# Patient Record
Sex: Female | Born: 1990 | Race: White | Hispanic: No | Marital: Single | Smoking: Never smoker
Health system: Southern US, Community
[De-identification: ages and names within clinical notes are randomized; demographics above are authoritative.]

## PROBLEM LIST (undated history)

## (undated) DIAGNOSIS — R569 Unspecified convulsions: Secondary | ICD-10-CM

## (undated) HISTORY — DX: Unspecified convulsions: R56.9

## (undated) HISTORY — PX: OTHER SURGICAL HISTORY: SHX169

---
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
## 2011-03-13 IMAGING — CR DG THORACOLUMBAR SPINE 2V
4 series · 4 of 4 positions shown · non-contrast
Comparison: Lumbar spine films dated 05/03/2006

CLINICAL DATA: Back pain and posterior neck pain.  History of
scoliosis.

THORACOLUMBAR SPINE - 2 VIEW

[view not recorded (1 of 4)]
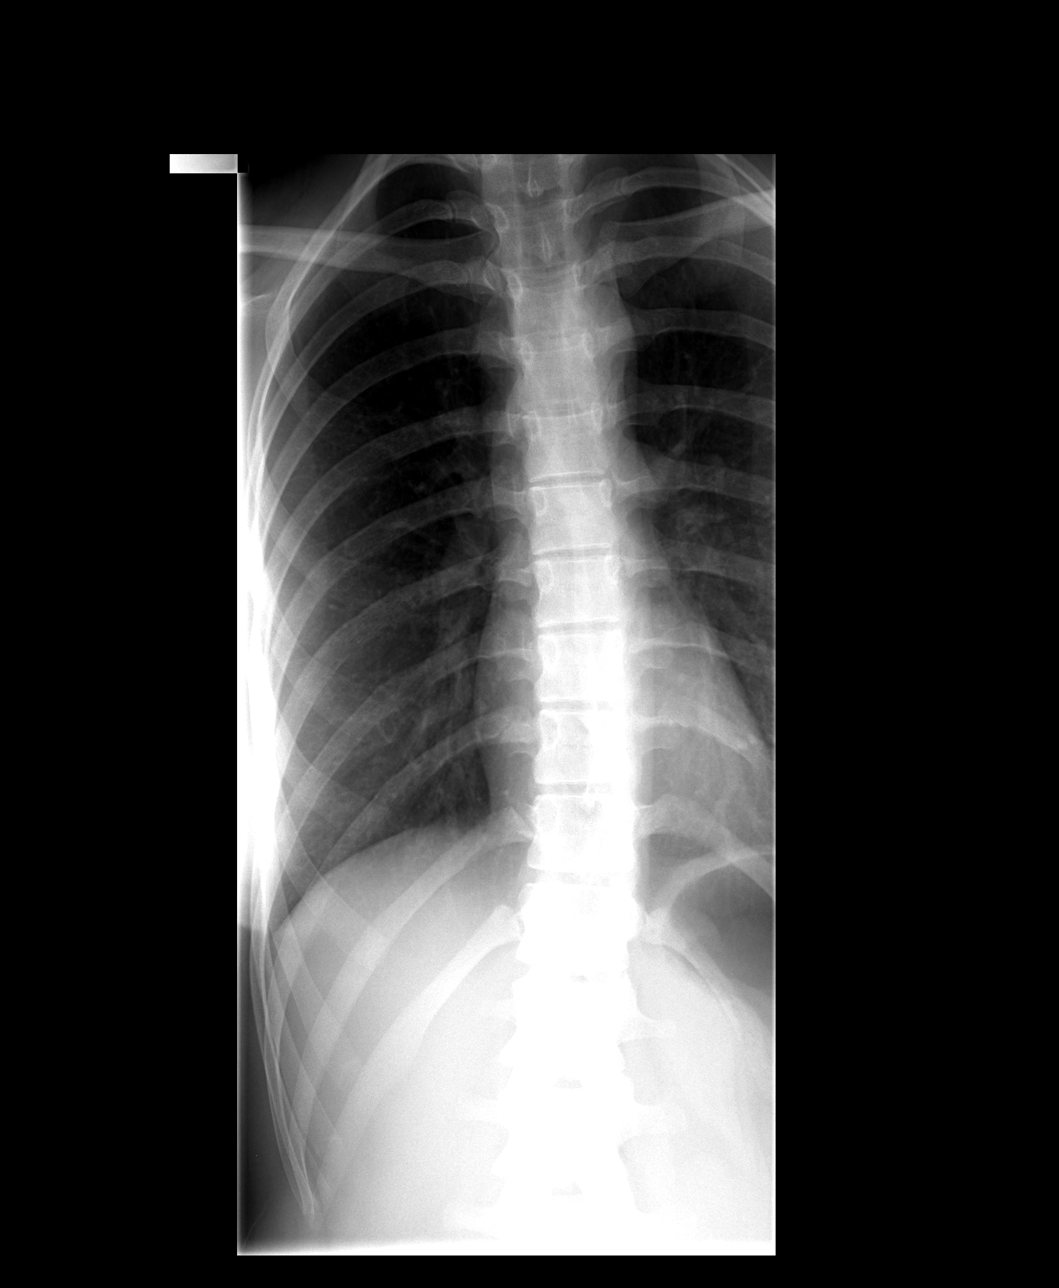

[view not recorded (2 of 4)]
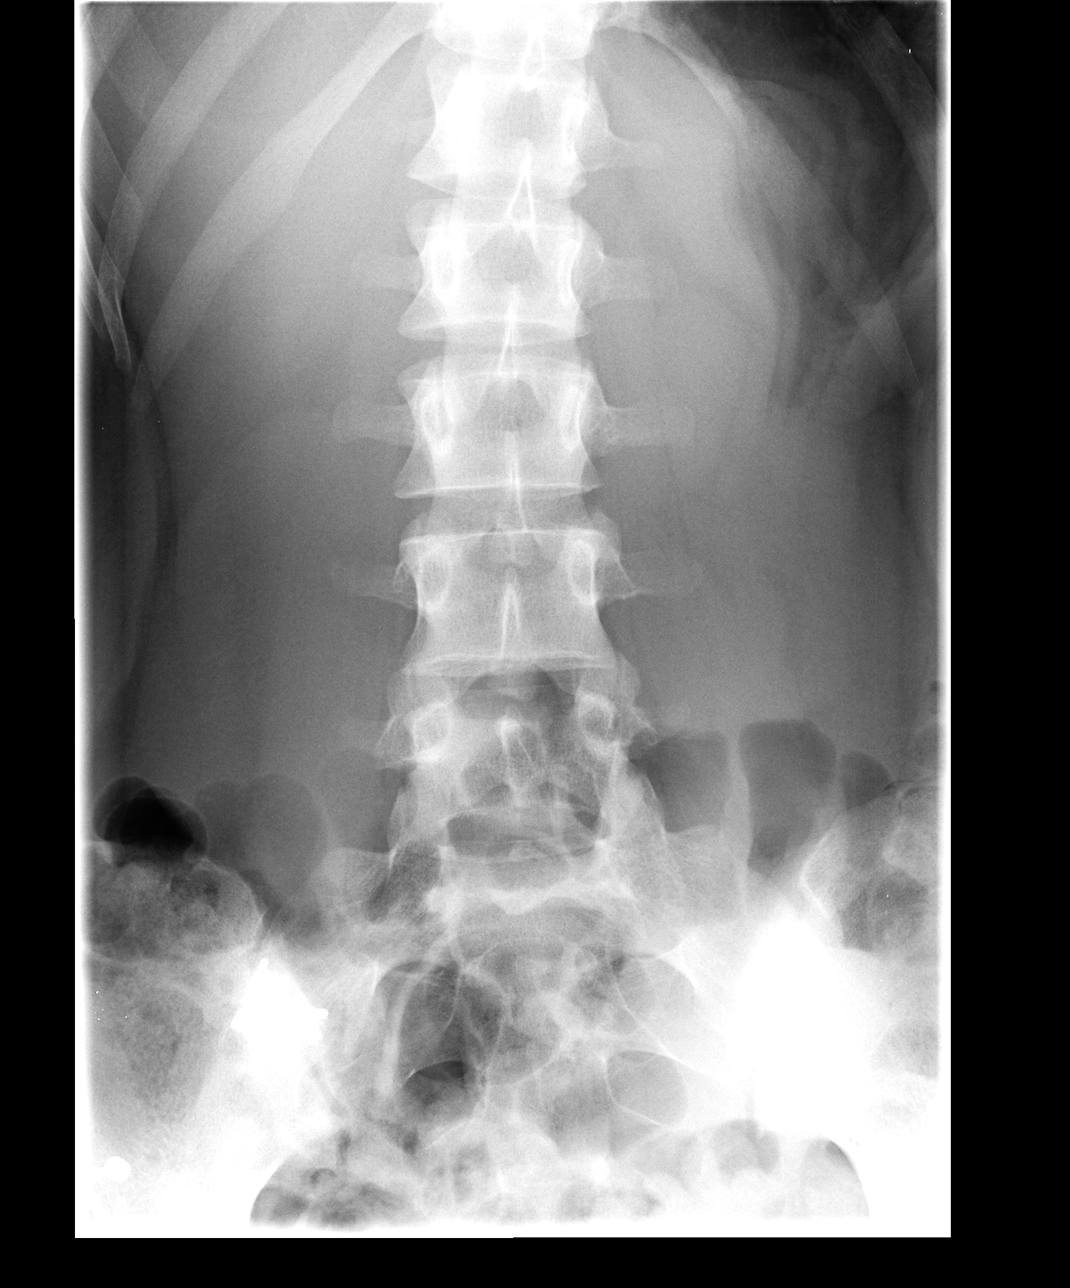

[view not recorded (3 of 4)]
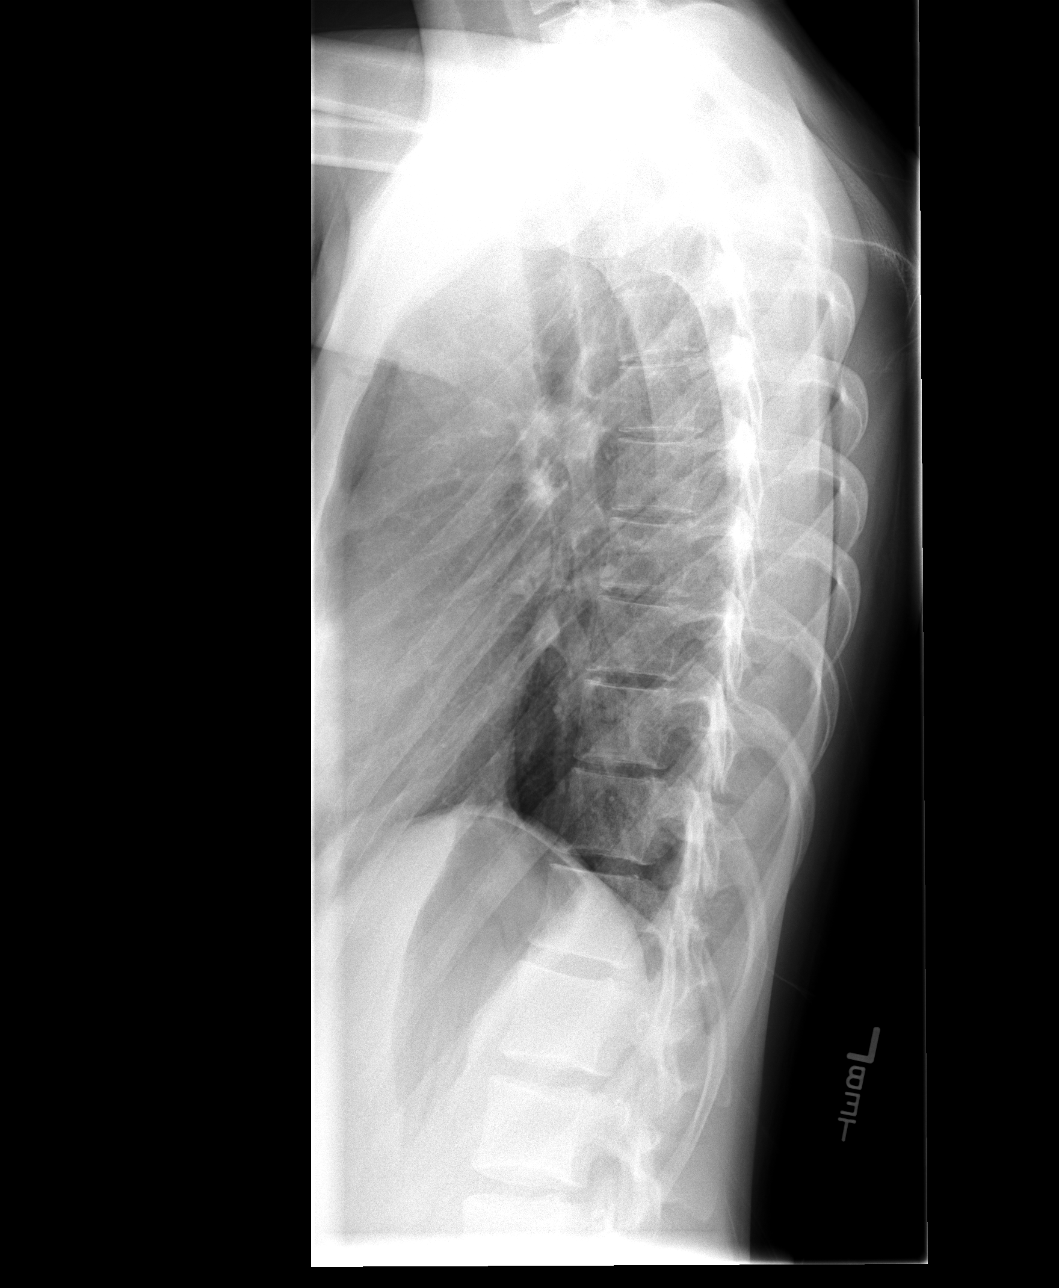

[view not recorded (4 of 4)]
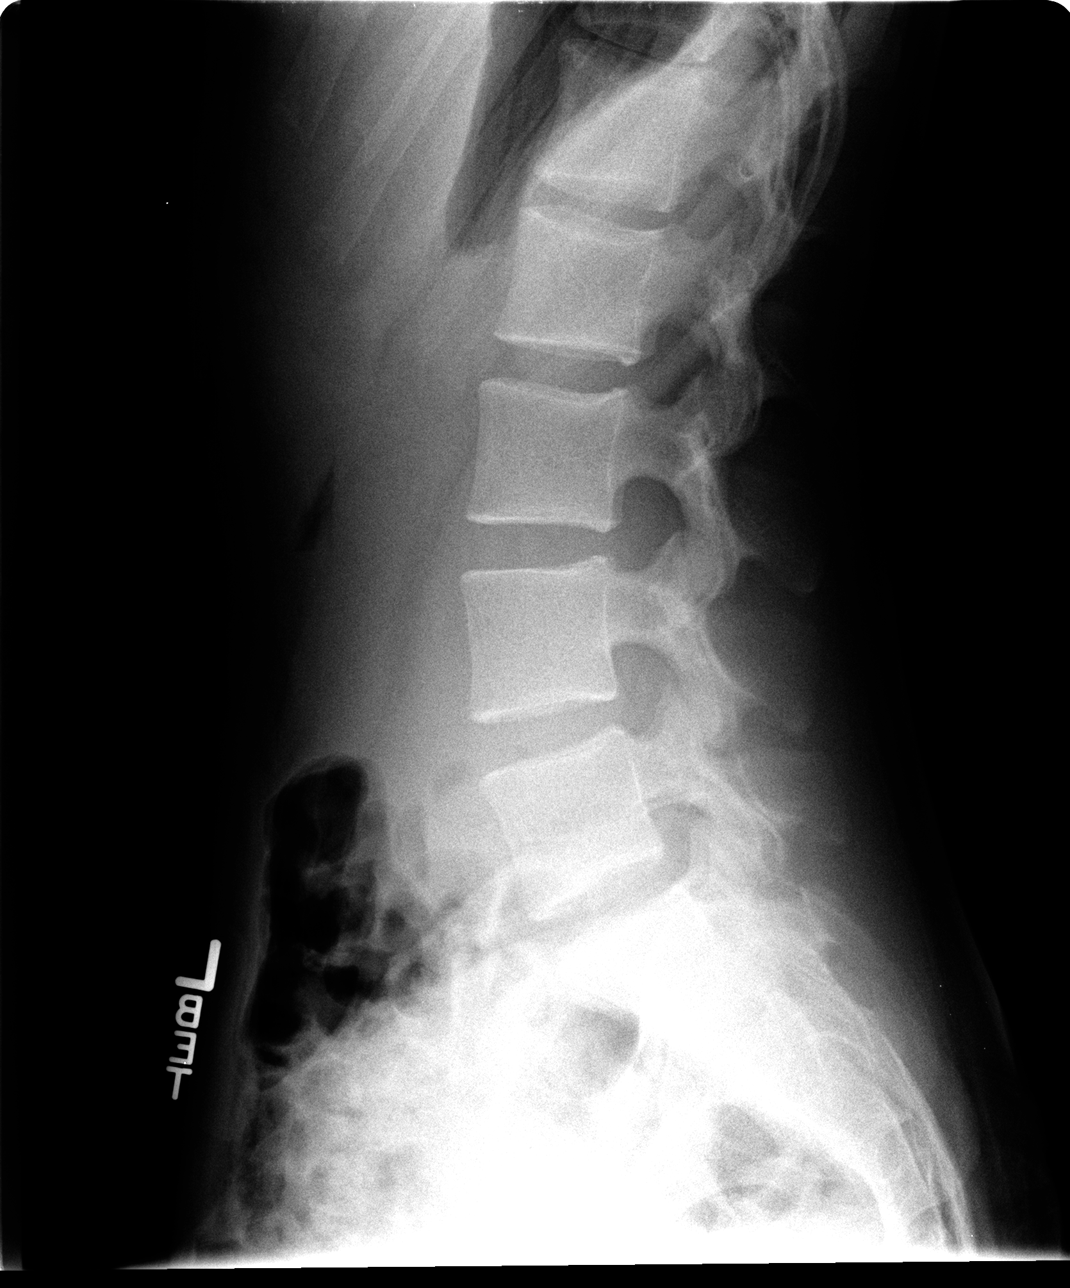

[4 of 4 positions shown; findings below may reference images not displayed]

FINDINGS: There is a mild thoracic scoliosis centered at T10 with
convexity to the left.  There is a 9 degree curvature.  There is a
mild rotoscoliosis of the lumbar spine, essentially unchanged since
the prior exam.
IMPRESSION: Mild thoracolumbar scoliosis as described.

## 2015-04-02 ENCOUNTER — Emergency Department (HOSPITAL_COMMUNITY)
Admission: EM | Admit: 2015-04-02 | Discharge: 2015-04-02 | Disposition: A | Discharge status: Home / Self Care | Payer: BLUE CROSS/BLUE SHIELD | Source: Home / Self Care | Attending: Family Medicine | Admitting: Family Medicine

## 2015-04-02 ENCOUNTER — Encounter (HOSPITAL_COMMUNITY): Payer: Self-pay | Admitting: Emergency Medicine

## 2015-04-02 DIAGNOSIS — J069 Acute upper respiratory infection, unspecified: Secondary | ICD-10-CM | POA: Diagnosis not present

## 2015-04-02 MED ORDER — AMOXICILLIN 250 MG PO CAPS: 250.0000 mg | ORAL_CAPSULE | Freq: Two times a day (BID) | ORAL | Status: DC

## 2015-04-02 MED ORDER — FLUTICASONE PROPIONATE 50 MCG/ACT NA SUSP: 2.0000 | Freq: Every day | NASAL | Status: AC

## 2015-04-02 NOTE — Discharge Instructions (Signed)
Upper Respiratory Infection, Adult °Most upper respiratory infections (URIs) are a viral infection of the air passages leading to the lungs. A URI affects the nose, throat, and upper air passages. The most common type of URI is nasopharyngitis and is typically referred to as "the common cold." °URIs run their course and usually go away on their own. Most of the time, a URI does not require medical attention, but sometimes a bacterial infection in the upper airways can follow a viral infection. This is called a secondary infection. Sinus and middle ear infections are common types of secondary upper respiratory infections. °Bacterial pneumonia can also complicate a URI. A URI can worsen asthma and chronic obstructive pulmonary disease (COPD). Sometimes, these complications can require emergency medical care and may be life threatening.  °CAUSES °Almost all URIs are caused by viruses. A virus is a type of germ and can spread from one person to another.  °RISKS FACTORS °You may be at risk for a URI if:  °· You smoke.   °· You have chronic heart or lung disease. °· You have a weakened defense (immune) system.   °· You are very young or very old.   °· You have nasal allergies or asthma. °· You work in crowded or poorly ventilated areas. °· You work in health care facilities or schools. °SIGNS AND SYMPTOMS  °Symptoms typically develop 2-3 days after you come in contact with a cold virus. Most viral URIs last 7-10 days. However, viral URIs from the influenza virus (flu virus) can last 14-18 days and are typically more severe. Symptoms may include:  °· Runny or stuffy (congested) nose.   °· Sneezing.   °· Cough.   °· Sore throat.   °· Headache.   °· Fatigue.   °· Fever.   °· Loss of appetite.   °· Pain in your forehead, behind your eyes, and over your cheekbones (sinus pain). °· Muscle aches.   °DIAGNOSIS  °Your health care provider may diagnose a URI by: °· Physical exam. °· Tests to check that your symptoms are not due to  another condition such as: °· Strep throat. °· Sinusitis. °· Pneumonia. °· Asthma. °TREATMENT  °A URI goes away on its own with time. It cannot be cured with medicines, but medicines may be prescribed or recommended to relieve symptoms. Medicines may help: °· Reduce your fever. °· Reduce your cough. °· Relieve nasal congestion. °HOME CARE INSTRUCTIONS  °· Take medicines only as directed by your health care provider.   °· Gargle warm saltwater or take cough drops to comfort your throat as directed by your health care provider. °· Use a warm mist humidifier or inhale steam from a shower to increase air moisture. This may make it easier to breathe. °· Drink enough fluid to keep your urine clear or pale yellow.   °· Eat soups and other clear broths and maintain good nutrition.   °· Rest as needed.   °· Return to work when your temperature has returned to normal or as your health care provider advises. You may need to stay home longer to avoid infecting others. You can also use a face mask and careful hand washing to prevent spread of the virus. °· Increase the usage of your inhaler if you have asthma.   °· Do not use any tobacco products, including cigarettes, chewing tobacco, or electronic cigarettes. If you need help quitting, ask your health care provider. °PREVENTION  °The best way to protect yourself from getting a cold is to practice good hygiene.  °· Avoid oral or hand contact with people with cold   symptoms.   °· Wash your hands often if contact occurs.   °There is no clear evidence that vitamin C, vitamin E, echinacea, or exercise reduces the chance of developing a cold. However, it is always recommended to get plenty of rest, exercise, and practice good nutrition.  °SEEK MEDICAL CARE IF:  °· You are getting worse rather than better.   °· Your symptoms are not controlled by medicine.   °· You have chills. °· You have worsening shortness of breath. °· You have brown or red mucus. °· You have yellow or brown nasal  discharge. °· You have pain in your face, especially when you bend forward. °· You have a fever. °· You have swollen neck glands. °· You have pain while swallowing. °· You have white areas in the back of your throat. °SEEK IMMEDIATE MEDICAL CARE IF:  °· You have severe or persistent: °¨ Headache. °¨ Ear pain. °¨ Sinus pain. °¨ Chest pain. °· You have chronic lung disease and any of the following: °¨ Wheezing. °¨ Prolonged cough. °¨ Coughing up blood. °¨ A change in your usual mucus. °· You have a stiff neck. °· You have changes in your: °¨ Vision. °¨ Hearing. °¨ Thinking. °¨ Mood. °MAKE SURE YOU:  °· Understand these instructions. °· Will watch your condition. °· Will get help right away if you are not doing well or get worse. °  °This information is not intended to replace advice given to you by your health care provider. Make sure you discuss any questions you have with your health care provider. °  °Document Released: 06/16/2000 Document Revised: 05/07/2014 Document Reviewed: 03/28/2013 °Elsevier Interactive Patient Education ©2016 [employer] ° °Cough, Adult °A cough helps to clear your throat and lungs. A cough may last only 2-3 weeks (acute), or it may last longer than 8 weeks (chronic). Many different things can cause a cough. A cough may be a sign of an illness or another medical condition. °HOME CARE °· Pay attention to any changes in your cough. °· Take medicines only as told by your doctor. °¨ If you were prescribed an antibiotic medicine, take it as told by your doctor. Do not stop taking it even if you start to feel better. °¨ Talk with your doctor before you try using a cough medicine. °· Drink enough fluid to keep your pee (urine) clear or pale yellow. °· If the air is dry, use a cold steam vaporizer or humidifier in your home. °· Stay away from things that make you cough at work or at home. °· If your cough is worse at night, try using extra pillows to raise your head up higher while you  sleep. °· Do not smoke, and try not to be around smoke. If you need help quitting, ask your doctor. °· Do not have caffeine. °· Do not drink alcohol. °· Rest as needed. °GET HELP IF: °· You have new problems (symptoms). °· You cough up yellow fluid (pus). °· Your cough does not get better after 2-3 weeks, or your cough gets worse. °· Medicine does not help your cough and you are not sleeping well. °· You have pain that gets worse or pain that is not helped with medicine. °· You have a fever. °· You are losing weight and you do not know why. °· You have night sweats. °GET HELP RIGHT AWAY IF: °· You cough up blood. °· You have trouble breathing. °· Your heartbeat is very fast. °  °This information is not intended to replace   advice given to you by your health care provider. Make sure you discuss any questions you have with your health care provider. °  °Document Released: 09/03/2010 Document Revised: 09/11/2014 Document Reviewed: 02/27/2014 °Elsevier Interactive Patient Education ©2016 [employer] Inc. ° °

## 2015-04-02 NOTE — ED Notes (Signed)
Here with sinus pressure, post nasal drip, and pain radiating behind eyes Started 3 days ago Both ears clogged, taking Mucinex with some relief

## 2015-04-02 NOTE — ED Provider Notes (Signed)
CSN: [id number]     Arrival date & time 04/02/15  1324 History   First MD Initiated Contact with Patient 04/02/15 1510     Chief Complaint  Patient presents with  . Sinus Problem   (Consider location/radiation/quality/duration/timing/severity/associated sxs/prior Treatment) HPI Sinus pressure and pain with  Drainage over the last couple weeks. Sudden onset of symptoms. Pain wit pressure,nasal congestion. OTC meds not helping.  History reviewed. No pertinent past medical history. Past Surgical History  Procedure Laterality Date  . Eustachian     No family history on file. Social History  Substance Use Topics  . Smoking status: Never Smoker   . Smokeless tobacco: None  . Alcohol Use: No   OB History    No data available     Review of Systems Sinus pain, congestion Allergies  Prednisone  Home Medications   Prior to Admission medications   Medication Sig Start Date End Date Taking? Authorizing Provider  Multiple Vitamin (MULTIVITAMIN) capsule Take 1 capsule by mouth daily.   Yes Historical Provider, MD  amoxicillin (AMOXIL) 250 MG capsule Take 1 capsule (250 mg total) by mouth 2 (two) times daily. 04/02/15   Tharon AquasFrank C Patrick, PA  fluticasone (FLONASE) 50 MCG/ACT nasal spray Place 2 sprays into both nostrils daily. 04/02/15   Tharon AquasFrank C Patrick, PA   Meds Ordered and Administered this Visit  Medications - No data to display  BP 134/70 mmHg  Pulse 80  Temp(Src) 98.9 F (37.2 C) (Oral)  Resp 16  SpO2 98%  LMP 03/09/2015 No data found.   Physical Exam NURSES NOTES AND VITAL SIGNS REVIEWED. CONSTITUTIONAL: Well developed, well nourished, no acute distress HEENT: normocephalic, atraumatic, right and left TM's are normal EYES: Conjunctiva normal NECK:normal ROM, supple, no adenopathy PULMONARY:No respiratory distress, normal effort, Lungs: CTAb/l, no wheezes, or increased work of breathing CARDIOVASCULAR: RRR, no murmur ABDOMEN: soft, ND, NT, +'ve BS MUSCULOSKELETAL:  Normal ROM of all extremities,  SKIN: warm and dry without rash PSYCHIATRIC: Mood and affect, behavior are normal  ED Course  Procedures (including critical care time)  Labs Review Labs Reviewed - No data to display  Imaging Review No results found.   Visual Acuity Review  Right Eye Distance:   Left Eye Distance:   Bilateral Distance:    Right Eye Near:   Left Eye Near:    Bilateral Near:       Rx amoxil  MDM   1. URI (upper respiratory infection)     Patient is reassured that there are no issues that require transfer to higher level of care at this time or additional tests. Patient is advised to continue home symptomatic treatment. Patient is advised that if there are new or worsening symptoms to attend the emergency department, contact primary care provider, or return to UC. Instructions of care provided discharged home in stable condition. Return to work/school note provided.   THIS NOTE WAS GENERATED USING A VOICE RECOGNITION SOFTWARE PROGRAM. ALL REASONABLE EFFORTS  WERE MADE TO PROOFREAD THIS DOCUMENT FOR ACCURACY.  I have verbally reviewed the discharge instructions with the patient. A printed AVS was given to the patient.  All questions were answered prior to discharge.      Tharon AquasFrank C Patrick, PA 04/02/15 [phone]

## 2015-12-15 ENCOUNTER — Encounter (HOSPITAL_COMMUNITY): Payer: Self-pay | Admitting: *Deleted

## 2015-12-15 ENCOUNTER — Emergency Department (HOSPITAL_COMMUNITY): Payer: BLUE CROSS/BLUE SHIELD

## 2015-12-15 ENCOUNTER — Emergency Department (HOSPITAL_COMMUNITY)
Admission: EM | Admit: 2015-12-15 | Discharge: 2015-12-15 | Disposition: A | Discharge status: Home / Self Care | Payer: BLUE CROSS/BLUE SHIELD | Attending: Emergency Medicine | Admitting: Emergency Medicine

## 2015-12-15 DIAGNOSIS — R4189 Other symptoms and signs involving cognitive functions and awareness: Secondary | ICD-10-CM

## 2015-12-15 DIAGNOSIS — R4020 Unspecified coma: Secondary | ICD-10-CM | POA: Diagnosis not present

## 2015-12-15 DIAGNOSIS — K5900 Constipation, unspecified: Secondary | ICD-10-CM

## 2015-12-15 DIAGNOSIS — R531 Weakness: Secondary | ICD-10-CM | POA: Diagnosis present

## 2015-12-15 LAB — COMPREHENSIVE METABOLIC PANEL
ALK PHOS: 76 U/L (ref 38–126)
ALT: 15 U/L (ref 14–54)
ANION GAP: 7 (ref 5–15)
AST: 21 U/L (ref 15–41)
Albumin: 4.6 g/dL (ref 3.5–5.0)
BUN: 8 mg/dL (ref 6–20)
CALCIUM: 9.4 mg/dL (ref 8.9–10.3)
CO2: 23 mmol/L (ref 22–32)
Chloride: 106 mmol/L (ref 101–111)
Creatinine, Ser: 0.59 mg/dL (ref 0.44–1.00)
Glucose, Bld: 105 mg/dL — ABNORMAL HIGH (ref 65–99)
Potassium: 3.9 mmol/L (ref 3.5–5.1)
SODIUM: 136 mmol/L (ref 135–145)
TOTAL PROTEIN: 8.6 g/dL — AB (ref 6.5–8.1)
Total Bilirubin: 0.5 mg/dL (ref 0.3–1.2)

## 2015-12-15 LAB — URINALYSIS, ROUTINE W REFLEX MICROSCOPIC
Bilirubin Urine: NEGATIVE
Glucose, UA: NEGATIVE mg/dL
Hgb urine dipstick: NEGATIVE
Ketones, ur: NEGATIVE mg/dL
Leukocytes, UA: NEGATIVE
NITRITE: NEGATIVE
PROTEIN: NEGATIVE mg/dL
SPECIFIC GRAVITY, URINE: 1.004 — AB (ref 1.005–1.030)
pH: 6 (ref 5.0–8.0)

## 2015-12-15 LAB — CBC WITH DIFFERENTIAL/PLATELET
BASOS ABS: 0.1 10*3/uL (ref 0.0–0.1)
BASOS PCT: 0 %
Eosinophils Absolute: 0 10*3/uL (ref 0.0–0.7)
Eosinophils Relative: 0 %
HEMATOCRIT: 43.9 % (ref 36.0–46.0)
HEMOGLOBIN: 15.3 g/dL — AB (ref 12.0–15.0)
Lymphocytes Relative: 14 %
Lymphs Abs: 1.6 10*3/uL (ref 0.7–4.0)
MCH: 33.2 pg (ref 26.0–34.0)
MCHC: 34.9 g/dL (ref 30.0–36.0)
MCV: 95.2 fL (ref 78.0–100.0)
MONOS PCT: 6 %
Monocytes Absolute: 0.7 10*3/uL (ref 0.1–1.0)
NEUTROS ABS: 9 10*3/uL — AB (ref 1.7–7.7)
NEUTROS PCT: 80 %
Platelets: 349 10*3/uL (ref 150–400)
RBC: 4.61 MIL/uL (ref 3.87–5.11)
RDW: 12.7 % (ref 11.5–15.5)
WBC: 11.3 10*3/uL — AB (ref 4.0–10.5)

## 2015-12-15 LAB — PREGNANCY, URINE: PREG TEST UR: NEGATIVE

## 2015-12-15 LAB — CBG MONITORING, ED: Glucose-Capillary: 113 mg/dL — ABNORMAL HIGH (ref 65–99)

## 2015-12-15 MED ORDER — SODIUM CHLORIDE 0.9 % IV BOLUS (SEPSIS)
1000.0000 mL | Freq: Once | INTRAVENOUS | Status: AC
  Administered 2015-12-15: 1000 mL via INTRAVENOUS

## 2015-12-15 NOTE — Discharge Instructions (Signed)
Drink plenty of fluids. Get miralax and put one dose or 17 g in 8 ounces of water,  take 1 dose every 30 minutes for 2-3 hours or until you  get good results and then once or twice daily to prevent constipation. Please follow up with Dr Sherwood GamblerFusco to finish your evaluation of being unresponsive.

## 2015-12-15 NOTE — ED Provider Notes (Signed)
[facility]-EMERGENCY DEPT Provider Note   CSN: [id number] Arrival date & time: 12/15/15  0133  Time seen 03:34 AM   History   Chief Complaint Chief Complaint  Patient presents with  . Weakness    HPI Sarah Valencia is a 25 y.o. female.  HPI patient states she had nausea all day on December 10. States she had some abdominal pain just above her umbilicus that feels like she's constipated. She states she normally only has a bowel movement twice a week and it has been several days since she had one. She had dysuria earlier in the week and started having urinary frequency today. She describes an increase of her appetite. She denies fever or chills. She states she's had a cough in the morning that she feels is from allergies and states sometimes she coughs up clear mucus. Her mother states she sleeps with her and noted during the night that the patient was "not breathing well". When asked what that meant she states she was breathing fast. She then tried to wake her up and couldn't wake her up. Her father carried her into the living room and patient does not recall that. She states what she remembers is waking up and EMS was there. Patient states now she just feels like her body is heavy. She denies feeling short of breath.  Patient states she had her last menses about 3 weeks ago and it was heavier than normal.   PCP Dr Sherwood GamblerFusco  History reviewed. No pertinent past medical history. Febrile seizure x 1 when 25 yrs old  There are no active problems to display for this patient.   Past Surgical History:  Procedure Laterality Date  . eustachian      OB History    No data available       Home Medications    None  Prior to Admission medications   Medication Sig Start Date End Date Taking? Authorizing Provider  fluticasone (FLONASE) 50 MCG/ACT nasal spray Place 2 sprays into both nostrils daily. 04/02/15  Yes Tharon AquasFrank C Patrick, PA  vitamin C (ASCORBIC ACID) 500 MG tablet Take 500 mg by  mouth daily.   Yes Historical Provider, MD    Family History No family history on file.  Social History Social History  Substance Use Topics  . Smoking status: Never Smoker  . Smokeless tobacco: Never Used  . Alcohol use No  employed as radiology tech at [employer]   Allergies   Prednisone   Review of Systems Review of Systems  All other systems reviewed and are negative.    Physical Exam Updated Vital Signs BP 118/83 (BP Location: Left Arm)   Pulse 83   Temp 98.4 F (36.9 C) (Oral)   Resp 16   Ht 5\' 2"  (1.575 m)   Wt 114 lb (51.7 kg)   LMP 11/26/2015   SpO2 99%   BMI 20.85 kg/m   Vital signs normal    Physical Exam  Constitutional: She is oriented to person, place, and time. She appears well-developed and well-nourished.  Non-toxic appearance. She does not appear ill. No distress.  HENT:  Head: Normocephalic and atraumatic.  Right Ear: External ear normal.  Left Ear: External ear normal.  Nose: Nose normal. No mucosal edema or rhinorrhea.  Mouth/Throat: Oropharynx is clear and moist and mucous membranes are normal. No dental abscesses or uvula swelling.  Eyes: Conjunctivae and EOM are normal. Pupils are equal, round, and reactive to light.  Neck: Normal range of motion  and full passive range of motion without pain. Neck supple.  Cardiovascular: Normal rate, regular rhythm and normal heart sounds.  Exam reveals no gallop and no friction rub.   No murmur heard. Pulmonary/Chest: Effort normal and breath sounds normal. No respiratory distress. She has no wheezes. She has no rhonchi. She has no rales. She exhibits no tenderness and no crepitus.  Abdominal: Soft. Normal appearance and bowel sounds are normal. She exhibits no distension. There is no tenderness. There is no rebound and no guarding.  Musculoskeletal: Normal range of motion. She exhibits no edema or tenderness.  Moves all extremities well.   Neurological: She is alert and oriented to person,  place, and time. She has normal strength. No cranial nerve deficit.  Skin: Skin is warm, dry and intact. No rash noted. No erythema. No pallor.  Psychiatric: She has a normal mood and affect. Her speech is normal and behavior is normal. Her mood appears not anxious.  Nursing note and vitals reviewed.    ED Treatments / Results  Labs (all labs ordered are listed, but only abnormal results are displayed) Results for orders placed or performed during the hospital encounter of 12/15/15  Comprehensive metabolic panel  Result Value Ref Range   Sodium 136 135 - 145 mmol/L   Potassium 3.9 3.5 - 5.1 mmol/L   Chloride 106 101 - 111 mmol/L   CO2 23 22 - 32 mmol/L   Glucose, Bld 105 (H) 65 - 99 mg/dL   BUN 8 6 - 20 mg/dL   Creatinine, Ser 1.61 0.44 - 1.00 mg/dL   Calcium 9.4 8.9 - 09.6 mg/dL   Total Protein 8.6 (H) 6.5 - 8.1 g/dL   Albumin 4.6 3.5 - 5.0 g/dL   AST 21 15 - 41 U/L   ALT 15 14 - 54 U/L   Alkaline Phosphatase 76 38 - 126 U/L   Total Bilirubin 0.5 0.3 - 1.2 mg/dL   GFR calc non Af Amer >60 >60 mL/min   GFR calc Af Amer >60 >60 mL/min   Anion gap 7 5 - 15  CBC with Differential  Result Value Ref Range   WBC 11.3 (H) 4.0 - 10.5 K/uL   RBC 4.61 3.87 - 5.11 MIL/uL   Hemoglobin 15.3 (H) 12.0 - 15.0 g/dL   HCT 04.5 40.9 - 81.1 %   MCV 95.2 78.0 - 100.0 fL   MCH 33.2 26.0 - 34.0 pg   MCHC 34.9 30.0 - 36.0 g/dL   RDW 91.4 78.2 - 95.6 %   Platelets 349 150 - 400 K/uL   Neutrophils Relative % 80 %   Neutro Abs 9.0 (H) 1.7 - 7.7 K/uL   Lymphocytes Relative 14 %   Lymphs Abs 1.6 0.7 - 4.0 K/uL   Monocytes Relative 6 %   Monocytes Absolute 0.7 0.1 - 1.0 K/uL   Eosinophils Relative 0 %   Eosinophils Absolute 0.0 0.0 - 0.7 K/uL   Basophils Relative 0 %   Basophils Absolute 0.1 0.0 - 0.1 K/uL  Pregnancy, urine  Result Value Ref Range   Preg Test, Ur NEGATIVE NEGATIVE  Urinalysis, Routine w reflex microscopic  Result Value Ref Range   Color, Urine STRAW (A) YELLOW   APPearance  CLEAR CLEAR   Specific Gravity, Urine 1.004 (L) 1.005 - 1.030   pH 6.0 5.0 - 8.0   Glucose, UA NEGATIVE NEGATIVE mg/dL   Hgb urine dipstick NEGATIVE NEGATIVE   Bilirubin Urine NEGATIVE NEGATIVE   Ketones, ur NEGATIVE NEGATIVE mg/dL  Protein, ur NEGATIVE NEGATIVE mg/dL   Nitrite NEGATIVE NEGATIVE   Leukocytes, UA NEGATIVE NEGATIVE  CBG monitoring, ED  Result Value Ref Range   Glucose-Capillary 113 (H) 65 - 99 mg/dL   Comment 1 Notify RN    Laboratory interpretation all normal except Mild leukocytosis    EKG  EKG Interpretation  Date/Time:  Monday December 15 2015 04:01:20 EST Ventricular Rate:  69 PR Interval:    QRS Duration: 87 QT Interval:  382 QTC Calculation: 410 R Axis:   64 Text Interpretation:  Sinus rhythm Borderline short PR interval Otherwise within normal limits No old tracing to compare Confirmed by Maci Eickholt  MD-I, Cutler Sunday ([id number]) on 12/15/2015 4:45:24 AM       Radiology Dg Abdomen 1 View  Result Date: 12/15/2015 CLINICAL DATA:  25 year old female with nausea and constipation. EXAM: ABDOMEN - 1 VIEW COMPARISON:  CT dated 12/02/2004 FINDINGS: Copious amount of stool noted throughout the colon. No bowel dilatation or evidence of obstruction. No free air or radiopaque calculi. The soft tissues and osseous structures appear unremarkable. IMPRESSION: Constipation.  No bowel obstruction. Electronically Signed   By: Elgie CollardArash  Radparvar M.D.   On: 12/15/2015 06:16    Procedures Procedures (including critical care time)  Medications Ordered in ED Medications  sodium chloride 0.9 % bolus 1,000 mL (1,000 mLs Intravenous New Bag/Given 12/15/15 0546)     Initial Impression / Assessment and Plan / ED Course  I have reviewed the triage vital signs and the nursing notes.  Pertinent labs & imaging results that were available during my care of the patient were reviewed by me and considered in my medical decision making (see chart for details).  Clinical Course    Laboratory  testing was ordered.  Orthostatic VS for the past 24 hrs:  BP- Lying Pulse- Lying BP- Sitting Pulse- Sitting BP- Standing at 0 minutes Pulse- Standing at 0 minutes  12/15/15 0415 104/77 71 107/73 85 100/79 88   Patient's orthostatic vital signs were normal  I rechecked patient and 5:30 AM. She states when they stood her up for her orthostatic vital signs she felt lightheaded. She was given 1 L of normal saline.  We discussed her x-ray results. She'll be instructed on MiraLAX protocol for constipation. She should follow-up with her PCP regarding further evaluation of her unresponsive episode. Mother seems upset that there was not a definitive diagnosis tonight, I tried to explain to her our job is to rule out life-threatening etiologies and that further evaluation to be done and her PCP office. She may need neurology or cardiology evaluation.  Final Clinical Impressions(s) / ED Diagnoses   Final diagnoses:  Unresponsive episode  Constipation, unspecified constipation type    New Prescriptions OTC miralax  Plan discharge  Devoria AlbeIva Carmellia Kreisler, MD, [facility] PyoFACEP    Braiden Rodman, MD 12/15/15 [phone]

## 2015-12-15 NOTE — ED Triage Notes (Signed)
Pt states weakness & nausea. Mom says pt did not appear to be breathing right & was hard to get up. EMS was called & advised pt to come to the ER to be checked out.

## 2016-01-08 ENCOUNTER — Ambulatory Visit (INDEPENDENT_AMBULATORY_CARE_PROVIDER_SITE_OTHER): Payer: BLUE CROSS/BLUE SHIELD | Admitting: Neurology

## 2016-01-08 ENCOUNTER — Encounter: Payer: Self-pay | Admitting: Neurology

## 2016-01-08 DIAGNOSIS — G40909 Epilepsy, unspecified, not intractable, without status epilepticus: Secondary | ICD-10-CM | POA: Diagnosis not present

## 2016-01-08 DIAGNOSIS — G4750 Parasomnia, unspecified: Secondary | ICD-10-CM

## 2016-01-08 DIAGNOSIS — G4730 Sleep apnea, unspecified: Secondary | ICD-10-CM | POA: Diagnosis not present

## 2016-01-08 DIAGNOSIS — R569 Unspecified convulsions: Secondary | ICD-10-CM

## 2016-01-08 NOTE — Patient Instructions (Signed)
I had a long discussion with the patient and her mother regarding her episodes of disordered sleep and involuntary movements representing possible nocturnal seizures versus parasomnias. I recommend referral to [facility] at [facility] for consideration for nocturnal polysomnogram as well as checking an EEG for electrical irritability. I also advised the patient to participate in activities for stress relaxation-like meditation, yoga and regular exercises to help treat her underlying anxiety and stress. Check MRI scan of the brain to look for any structural abnormalities in the brain. Return for follow-up in 3 months or call earlier if necessary

## 2016-01-08 NOTE — Progress Notes (Signed)
[facility] [address] [place]. [place] [address] [phone]       OFFICE CONSULT NOTE  Ms. ADA HOLNESS Date of Birth:  [birth year] Medical Record Number:  [id number]   Referring MD:  Terie Purser, PA-C Reason for Referral:  ? seizure HPI: Ms Sarah Valencia is a 26 year Caucasian lady who is accompanied today by her mother. Patient had episode on 12/15/15 when the mother who sleeps in the same room as her workup the patient making making some noises and asleep. She was unable to wake up the patient and felt concerned and eventually called EMS. Patient eventually woke up and she was in the ambulance and reached reaching the hospital. She felt slightly confused and dizzy and lightheaded and felt tired and fatigued. She did not remember events for couple of hours. The patient since was seen in the emergency room and felt to be dehydrated and had possibly had a syncope. All lab work done in the year including chemistry panel and CBC were unremarkable. No brain imaging was done. Patient subsequently went the has had 2 other episodes since sleep: Mother has noticed that she's had involuntary extension of her right arm going up and the mother had to force her arm down. The patient did not have any jerky movements at that time. It took her 15 minutes to awaken the patient and that time. The patient has had no events during the daytime. She has a remote history of a single febrile seizure as a child  2 in 78 but none since then. She was not placed on any seizure medications. She denies any history of head injuries significant loss of consciousness stroke, TIA or migraine headaches. We'll have trouble sleeping and does not get restful sleep and feels quite tired during the day. She has no history of sleep apnea. She works as the Publishing rights manager. She does admit that she is under a lot of stress. She's not had a sleep study done.  ROS:   14 system review of systems is positive for  fatigue, ringing in the ears, constipation, flushing, confusion, dizziness, anxiety, not enough sleep, decreased energy, change in appetite, insomnia, sleepiness and all other systems negative  PMH:  Past Medical History:  Diagnosis Date  . Seizures (HCC)    seizures at age 26    Social History:  Social History   Social History  . Marital status: Single    Spouse name: N/A  . Number of children: N/A  . Years of education: N/A   Occupational History  . Not on file.   Social History Main Topics  . Smoking status: Never Smoker  . Smokeless tobacco: Never Used  . Alcohol use No  . Drug use: No  . Sexual activity: Not on file   Other Topics Concern  . Not on file   Social History Narrative  . No narrative on file    Medications:   Current Outpatient Prescriptions on File Prior to Visit  Medication Sig Dispense Refill  . fluticasone (FLONASE) 50 MCG/ACT nasal spray Place 2 sprays into both nostrils daily. 9.9 g 2  . vitamin C (ASCORBIC ACID) 500 MG tablet Take 500 mg by mouth daily.     No current facility-administered medications on file prior to visit.     Allergies:   Allergies  Allergen Reactions  . Prednisone Shortness Of Breath    rash    Physical Exam General: Frail cachectic-looking young Caucasian lady seated, in no evident distress  Head: head normocephalic and atraumatic.   Neck: supple with no carotid or supraclavicular bruits Cardiovascular: regular rate and rhythm, no murmurs Musculoskeletal: no deformity Skin:  no rash/petichiae Vascular:  Normal pulses all extremities  Neurologic Exam Mental Status: Awake and fully alert. Oriented to place and time. Recent and remote memory intact. Attention span, concentration and fund of knowledge appropriate. Mood and affect appropriate.  Cranial Nerves: Fundoscopic exam reveals sharp disc margins. Pupils equal, briskly reactive to light. Extraocular movements full without nystagmus. Visual fields full to  confrontation. Hearing intact. Facial sensation intact. Face, tongue, palate moves normally and symmetrically.  Motor: Normal bulk and tone. Normal strength in all tested extremity muscles. Sensory.: intact to touch , pinprick , position and vibratory sensation.  Coordination: Rapid alternating movements normal in all extremities. Finger-to-nose and heel-to-shin performed accurately bilaterally. Gait and Station: Arises from chair without difficulty. Stance is normal. Gait demonstrates normal stride length and balance . Able to heel, toe and tandem walk without difficulty.  Reflexes: 1+ and symmetric. Toes downgoing.       ASSESSMENT: [age] year Caucasian lady with episode of unresponsiveness followed by some memory loss of month ago and 2 other brief episodes of involuntary movements in sleep of unclear etiology. 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 I recommend [facility] for [facility] for nocturnal polysomnogram as well as checking an EEG for electrical irritability. I also advised the patient to participate in activities for stress relaxation-like meditation, yoga and regular exercises to help treat her underlying anxiety and stress. Check MRI scan of the brain to look for any structural abnormalities in the brain. Greater than 50% time during this 45 minute consultation visit was spent on counseling and coordination of care about her sleep disturbance episodes and seizures Return for follow-up in 3 months or call earlier if necessary Delia HeadyPramod Evin Chirco, MD  [facility] [address] [place], [place] [id number]  Phone [phone] Fax [phone] Note: This document was prepared with digital dictation and  possible smart phrase technology. Any transcriptional errors that result from this process are unintentional.

## 2016-01-20 ENCOUNTER — Ambulatory Visit (INDEPENDENT_AMBULATORY_CARE_PROVIDER_SITE_OTHER): Payer: BLUE CROSS/BLUE SHIELD | Admitting: Neurology

## 2016-01-20 DIAGNOSIS — G4750 Parasomnia, unspecified: Secondary | ICD-10-CM

## 2016-01-20 DIAGNOSIS — G40909 Epilepsy, unspecified, not intractable, without status epilepticus: Secondary | ICD-10-CM | POA: Diagnosis not present

## 2016-01-20 DIAGNOSIS — R569 Unspecified convulsions: Secondary | ICD-10-CM

## 2016-01-20 DIAGNOSIS — G4730 Sleep apnea, unspecified: Secondary | ICD-10-CM

## 2016-01-21 ENCOUNTER — Other Ambulatory Visit: Payer: BLUE CROSS/BLUE SHIELD

## 2016-01-26 ENCOUNTER — Telehealth: Payer: Self-pay | Admitting: Neurology

## 2016-01-26 NOTE — Telephone Encounter (Signed)
Pt would like a call back about EEG asap. Thanks

## 2016-01-26 NOTE — Telephone Encounter (Signed)
Rn call patients listed number and spoke with the mom. The pt was not available. Pts mom is listed on the DPR form. Pt stated her daughter just had concerns and wanted to know the results and got a vm. Rn explain to mom as stated Dr.Sethi works in the hospital every other week.PTs mom ask about the results. Rn stated a message will be sent to Dr. Pearlean BrownieSethi for a return call. Pts mom verbalized understanding.

## 2016-01-26 NOTE — Telephone Encounter (Signed)
I called the patient and gave her the results of EEG showing slight irritability on the right side of the brain but no definitive seizure activity. I encouraged her to keep her appointments MRI scan of the brain as well as sleep consult. If she has any  further episodes of unresponsiveness may consider trial of seizure medications at that time. She voiced understanding.

## 2016-01-26 NOTE — Telephone Encounter (Signed)
Patient returning a call from Friday believes its in reference to EEG results.  Please call

## 2016-01-27 ENCOUNTER — Other Ambulatory Visit: Payer: Self-pay

## 2016-01-27 ENCOUNTER — Telehealth: Payer: Self-pay | Admitting: Neurology

## 2016-01-27 DIAGNOSIS — R569 Unspecified convulsions: Secondary | ICD-10-CM

## 2016-01-27 DIAGNOSIS — G40909 Epilepsy, unspecified, not intractable, without status epilepticus: Secondary | ICD-10-CM

## 2016-01-27 NOTE — Telephone Encounter (Signed)
Dr. Pearlean BrownieSethi can you please put a new order in for Brain w/wo contrast. She was scheduled to have the [facility] but she came in last week and informed me that she didn't want it here and that she [facility]. GI just called me and informed since the order is sign to our office because she was originally scheduled for our unit they couldn't use that order.

## 2016-01-27 NOTE — Telephone Encounter (Signed)
New order put in for MR brain w/wo contrast [facility]

## 2016-01-28 NOTE — Telephone Encounter (Signed)
Noted, thank you I will refax the order to GI.

## 2016-02-05 ENCOUNTER — Encounter: Payer: Self-pay | Admitting: Neurology

## 2016-02-05 ENCOUNTER — Ambulatory Visit (INDEPENDENT_AMBULATORY_CARE_PROVIDER_SITE_OTHER): Payer: BLUE CROSS/BLUE SHIELD | Admitting: Neurology

## 2016-02-05 VITALS — BP 102/68 | HR 70 | Resp 14 | Ht 62.0 in | Wt 115.0 lb

## 2016-02-05 DIAGNOSIS — R6889 Other general symptoms and signs: Secondary | ICD-10-CM

## 2016-02-05 DIAGNOSIS — G4750 Parasomnia, unspecified: Secondary | ICD-10-CM | POA: Diagnosis not present

## 2016-02-05 DIAGNOSIS — G2581 Restless legs syndrome: Secondary | ICD-10-CM

## 2016-02-05 DIAGNOSIS — F513 Sleepwalking [somnambulism]: Secondary | ICD-10-CM

## 2016-02-05 DIAGNOSIS — Z9189 Other specified personal risk factors, not elsewhere classified: Secondary | ICD-10-CM | POA: Diagnosis not present

## 2016-02-05 DIAGNOSIS — R9401 Abnormal electroencephalogram [EEG]: Secondary | ICD-10-CM | POA: Diagnosis not present

## 2016-02-05 DIAGNOSIS — Z87898 Personal history of other specified conditions: Secondary | ICD-10-CM

## 2016-02-05 DIAGNOSIS — Z789 Other specified health status: Secondary | ICD-10-CM

## 2016-02-05 NOTE — Progress Notes (Signed)
Subjective:    Patient ID: Sarah Valencia is a 26 y.o. female.  HPI:    Huston FoleySaima Sydna Brodowski, MD, PhD [facility] [address], [address] [address] [place], [place] [address]   Dear Janalyn ShyPramod,   I saw your patient, Sarah Valencia, upon your kind request in my clinic today for initial consultation of her sleep disorder, concern for parasomnias. The patient is unaccompanied today. As you know, Sarah Valencia is a 26 year old right-handed woman with an underlying medical history of seizures as a child who was seen by you for episodes of reduced consciousness and concern for seizure. I reviewed your office note from 01/08/2016. She had an EEG on 01/20/2016 which I reviewed the report: This is a mildly abnormal EEG due to the presence of mild focal right temporal cortical irritability, however no definitive epileptiform activities noted.  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 She endorses stress. She lives with her parents, has no children.  No significant snoring, had one episode on 12/15/15 of making abnormal sounds in sleep, mom slept in the same bed with her and tried to wake her up, she couldn't wake her, called EMS. She was able to walk, but fell half out of bed, dad walker her over to the living room and EMT came, which she does recall, but nothing else.  Had one febrile Sz at age 40, and Hx of sleep walking. Stress level is higher currently at work and there is marital tension at home. She has an older (by 18 years) half brother from her dad's side, no close relationship, but his 26 yo son lives in the household with him (she calls them brother although he is her nephew, as he does not know his parents and her dad has custody). She has had trouble with sleep for years, since her teenage years. She has difficulty falling asleep and staying asleep. She often falls asleep on the couch in the evening and then goes to bed around midnight but then has  trouble going to sleep. She has a wakeup time of 6:50 AM. She has occasional restless leg symptoms, she is not sure if she twitches her legs and her sleep. She is supposed to get an MRI brain which she had to reschedule. She takes melatonin in the middle of the night when she wakes up, often around midnight when she actually goes to bed. She has in the past taken Ambien which helped but she did not want to continue to take it. She has also been on Xanax as needed for anxiety, not currently. She has no recollection of the event on 12/15/2015. She remembers seeing the EMT. She had no bowel or bladder incontinence, no tongue bite, no convulsive shaking. She denies recurrent headaches. She is a nonsmoker and does not drink alcohol or use illicit drugs. She does drink quite a bit of caffeine, about 5 servings per day, as late as bedtime. She reports that she has actually cut back on her caffeine. Her Epworth sleepiness score is 7 out of 24, her fatigue score is 45 out of 63.  Her Past Medical History Is Significant For: Past Medical History:  Diagnosis Date  . Seizures (HCC)    seizures at age 40    Her Past Surgical History Is Significant For: Past Surgical History:  Procedure Laterality Date  . eustachian    . tubees in ears      Her Family History Is Significant For: Family  History  Problem Relation Age of Onset  . Hyperlipidemia Mother   . Lung cancer Father     Her Social History Is Significant For: Social History   Social History  . Marital status: Single    Spouse name: N/A  . Number of children: 0  . Years of education: Assoc    Social History Main Topics  . Smoking status: Never Smoker  . Smokeless tobacco: Never Used  . Alcohol use No  . Drug use: No  . Sexual activity: Not Asked   Other Topics Concern  . None   Social History Narrative   Drinks about 5 caffeine drinks a day     Her Allergies Are:  Allergies  Allergen Reactions  . Prednisone Shortness Of Breath     rash  :   Her Current Medications Are:  Outpatient Encounter Prescriptions as of 02/05/2016  Medication Sig  . fluticasone (FLONASE) 50 MCG/ACT nasal spray Place 2 sprays into both nostrils daily.  . vitamin C (ASCORBIC ACID) 500 MG tablet Take 500 mg by mouth daily.   No facility-administered encounter medications on file as of 02/05/2016.   :  Review of Systems:  Out of a complete 14 point review of systems, all are reviewed and negative with the exception of these symptoms as listed below: Review of Systems  Neurological:       Patient has trouble staying asleep, she reports that she gets tired very easily at home, she states that she is having trouble carrying out everyday activities, patient says that her mother heard patient "choking" a couple of times and had difficulty waking up, wakes up feeling tired, morning headaches, daytime fatigue, takes naps during the day.   Takes Melatonin, will take it once she wakes up at night.  Tried Ambien and xanax short term in the past.  H/O febrile seizure.    Epworth Sleepiness Scale 0= would never doze 1= slight chance of dozing 2= moderate chance of dozing 3= high chance of dozing  Sitting and reading:0 Watching TV:3 Sitting inactive in a public place (ex. Theater or meeting):0 As a passenger in a car for an hour without a break:1 Lying down to rest in the afternoon:3 Sitting and talking to someone:0 Sitting quietly after lunch (no alcohol):0 In a car, while stopped in traffic:0 Total:7  Objective:  Neurologic Exam  Physical Exam Physical Examination:   Vitals:   02/05/16 0918  BP: 102/68  Pulse: 70  Resp: 14   General Examination: The patient is a very pleasant 26 y.o. female in no acute distress. She appears well-developed and well-nourished and well groomed.   HEENT: Normocephalic, atraumatic, pupils are equal, round and reactive to light and accommodation. Funduscopic exam is normal with sharp disc margins noted.  Extraocular tracking is good without limitation to gaze excursion or nystagmus noted. Normal smooth pursuit is noted. Hearing is grossly intact. Tympanic membranes are clear bilaterally. Face is symmetric with normal facial animation and normal facial sensation. Speech is clear with no dysarthria noted. There is no hypophonia. There is no lip, neck/head, jaw or voice tremor. Neck is supple with full range of passive and active motion. There are no carotid bruits on auscultation. Oropharynx exam reveals: mild mouth dryness, good dental hygiene and mild airway crowding, due to smaller airway entry. Mallampati is class I. Tongue protrudes centrally and palate elevates symmetrically. Tonsils are 1+ in size. Neck size is slender.   Chest: Clear to auscultation without wheezing, rhonchi or crackles noted.  Heart: S1+S2+0, regular and normal without murmurs, rubs or gallops noted.   Abdomen: Soft, non-tender and non-distended with normal bowel sounds appreciated on auscultation.  Extremities: There is no pitting edema in the distal lower extremities bilaterally. Pedal pulses are intact.  Skin: Warm and dry without trophic changes noted.  Musculoskeletal: exam reveals no obvious joint deformities, tenderness or joint swelling or erythema.   Neurologically:  Mental status: The patient is awake, alert and oriented in all 4 spheres. Her immediate and remote memory, attention, language skills and fund of knowledge are appropriate. There is no evidence of aphasia, agnosia, apraxia or anomia. Speech is clear with normal prosody and enunciation. Thought process is linear. Mood is normal and affect is normal.  Cranial nerves II - XII are as described above under HEENT exam. In addition: shoulder shrug is normal with equal shoulder height noted. Motor exam: Normal bulk, strength and tone is noted. There is no drift, tremor or rebound. Romberg is negative. Reflexes are 2+ throughout. Fine motor skills and  coordination: intact with normal finger taps, normal hand movements, normal rapid alternating patting, normal foot taps and normal foot agility.  Cerebellar testing: No dysmetria or intention tremor on finger to nose testing. Heel to shin is unremarkable bilaterally. There is no truncal or gait ataxia.  Sensory exam: intact to light touch, pinprick, vibration, temperature sense in the upper and lower extremities.  Gait, station and balance: She stands easily. No veering to one side is noted. No leaning to one side is noted. Posture is age-appropriate and stance is narrow based. Gait shows normal stride length and normal pace. No problems turning are noted. ** in 3 steps. Tandem walk is unremarkable.              Assessment and Plan:  In summary, ENSLEE BIBBINS is a very pleasant 26 y.o.-year old female with an underlying medical history of febrile seizure at age 51, and history of anxiety, who presents for a sleep evaluation secondary to parasomnias. She has a history of sleepwalking and recently had an episode of decreased attentiveness and prolonged parasomnia, differential diagnosis includes nocturnal seizure. She has had some workup for this including EEG which was not completely normal, she is supposed to have a brain MRI as well. She has a follow-up in April to discuss diagnostics. From my end of things, I suggested we proceed with a nocturnal polysomnogram to further delineate her sleep-related issues. She does endorse mild symptoms of restless leg syndrome. I did talk to her about improving her sleep hygiene. Furthermore, she is cautioned about the use of her caffeine that she does take in quite a bit of caffeine. She is advised to try to avoid caffeine after 5 PM. I had a long chat with the patient about my findings and her sleep-related complaints. She is not overtly sleepy but her fatigue score indicates tiredness. Some of this could be explained by sleep deprivation and poor sleep hygiene.  Nevertheless, we are justified in doing overnight sleep testing. I explained the sleep study to her and she is in agreement. I will see her back for discussion after her sleep studies completed. I answered all her questions today and the patient was in agreement with the plan. She has an appointment pending with you in April. Thank you very much for allowing me to participate in the care of this nice patient. If I can be of any further assistance to you please do not hesitate to talk to me.  Sincerely,   Huston Foley, MD, PhD

## 2016-02-05 NOTE — Patient Instructions (Addendum)
  You can try Melatonin at night for sleep: take 3 to 5 mg around 10 PM, not at midnight.   We will do sleep study testing to further delineate your sleep issues.   I will likely see you back after your sleep study to go over the test results and where to go from there. We will call you after your sleep study to advise about the results (most likely, you will hear from Diana, my nurse) and to Sarah Valencia up an appointment at the time, as necessary.    Our sleep lab administrative assistant, Alvis LemmingsDawn will meet with you or call you to schedule your sleep study. If you don't hear back from her by next week please feel free to call her at [phone]. This is her direct line and please leave a message with your phone number to call back if you get the voicemail box. She will call back as soon as possible.

## 2016-02-17 ENCOUNTER — Telehealth: Payer: Self-pay | Admitting: Neurology

## 2016-02-17 DIAGNOSIS — G4750 Parasomnia, unspecified: Secondary | ICD-10-CM

## 2016-02-17 NOTE — Telephone Encounter (Signed)
[employer] denied Split [facility] but approved HST.  Can I get an order for HST?

## 2016-02-18 ENCOUNTER — Ambulatory Visit (INDEPENDENT_AMBULATORY_CARE_PROVIDER_SITE_OTHER): Payer: BLUE CROSS/BLUE SHIELD | Admitting: Neurology

## 2016-02-18 ENCOUNTER — Other Ambulatory Visit: Payer: BLUE CROSS/BLUE SHIELD

## 2016-02-18 ENCOUNTER — Ambulatory Visit (INDEPENDENT_AMBULATORY_CARE_PROVIDER_SITE_OTHER): Payer: BLUE CROSS/BLUE SHIELD

## 2016-02-18 DIAGNOSIS — G40909 Epilepsy, unspecified, not intractable, without status epilepticus: Secondary | ICD-10-CM | POA: Diagnosis not present

## 2016-02-18 DIAGNOSIS — R569 Unspecified convulsions: Secondary | ICD-10-CM

## 2016-02-18 DIAGNOSIS — G4710 Hypersomnia, unspecified: Secondary | ICD-10-CM | POA: Diagnosis not present

## 2016-02-18 DIAGNOSIS — R404 Transient alteration of awareness: Secondary | ICD-10-CM

## 2016-02-18 MED ORDER — GADOPENTETATE DIMEGLUMINE 469.01 MG/ML IV SOLN: 10.0000 mL | Freq: Once | INTRAVENOUS | Status: AC | PRN

## 2016-02-20 NOTE — Progress Notes (Signed)
Patient referred by Dr. Pearlean BrownieSethi, seen by me on 02/05/16, HST on 02/18/16.   Please call and notify the patient that the recent home sleep test did not show any significant obstructive sleep apnea. Patient can follow up with the referring provider.   Once you have spoken to patient, you can close this encounter.   Thanks,  Huston FoleySaima Merwin Breden, MD, PhD [facility]

## 2016-02-20 NOTE — Procedures (Signed)
  [facility] [address], [address] [place], [place] [address]  NAME: Sarah LadeJessica Valencia DOB: 02/13/1990 MEDICAL RECORD [id number] DOS: 02/19/16 REFERRING PHYSICIAN: Elfredia NevinsLawrence Fusco, MD  Study Performed:  HST/Out of Center Sleep Test  HISTORY: 26 year old woman with a history of seizures as a child, who reports episodes of reduced consciousness, history of sleep walking and trouble with sleep for years, including difficulty falling asleep and staying asleep. Her Epworth sleepiness score is 7 out of 24, her fatigue score is 45 out of 63. BMI of 21.  STUDY RESULTS:  Total Recording Time: 7h 6015m   Total Apnea/Hypopnea Index (AHI): 0.8/hour  Average Oxygen Saturation: 96%  Lowest Oxygen Saturation: 91%  Average Mean Heart Rate: 67 bpm  IMPRESSION: Transient alteration of consciousness (history of)    RECOMMENDATION:  This home sleep test does not demonstrate any significant obstructive or central sleep disordered breathing. Other causes of the patient's symptoms, including circadian rhythm disturbances, an underlying mood disorder, medication effect and/or an underlying medical problem cannot be ruled out based on this test. Clinical correlation is recommended. The patient can follow up with her referring provider, who will be notified of the test results.   I certify that I have reviewed the raw data recording prior to the issuance of this report in accordance with the standards of Accreditation of the American Academy of Sleep medicine (AASM).   Huston FoleySaima Vallorie Niccoli, MD, PhD Diplomat, ABPN (Neurology and Sleep)

## 2016-02-24 ENCOUNTER — Telehealth: Payer: Self-pay

## 2016-02-24 NOTE — Telephone Encounter (Signed)
-----   Message from Micki RileyPramod S Sethi, MD sent at 02/21/2016 10:05 PM EST ----- Kindly inform patient that MRI scan of brain was normal

## 2016-02-24 NOTE — Telephone Encounter (Signed)
Rn call patient that her MRI scan of brain was normal. Pt verbalized understanding.

## 2016-02-24 NOTE — Telephone Encounter (Signed)
I called pt. I advised her of her sleep study results. Pt will follow up with Dr. Pearlean BrownieSethi and asked that I send a copy of this study to Dr. Sherwood GamblerFusco. Pt verbalized understanding of results. Pt had no questions at this time but was encouraged to call back if questions arise.

## 2016-02-24 NOTE — Telephone Encounter (Signed)
-----   Message from Huston FoleySaima Athar, MD sent at 02/20/2016  1:49 PM EST ----- Patient referred by Dr. Pearlean BrownieSethi, seen by me on 02/05/16, HST on 02/18/16.   Please call and notify the patient that the recent home sleep test did not show any significant obstructive sleep apnea. Patient can follow up with the referring provider.   Once you have spoken to patient, you can close this encounter.   Thanks,  Huston FoleySaima Athar, MD, PhD [facility]

## 2016-04-12 ENCOUNTER — Encounter: Payer: Self-pay | Admitting: Neurology

## 2016-04-12 ENCOUNTER — Ambulatory Visit (INDEPENDENT_AMBULATORY_CARE_PROVIDER_SITE_OTHER): Payer: BLUE CROSS/BLUE SHIELD | Admitting: Neurology

## 2016-04-12 VITALS — BP 103/69 | HR 77 | Wt 121.0 lb

## 2016-04-12 DIAGNOSIS — R404 Transient alteration of awareness: Secondary | ICD-10-CM | POA: Diagnosis not present

## 2016-04-12 NOTE — Progress Notes (Addendum)
[facility] [address] [place]. Sarah [place] [address] [phone]       OFFICE FOLLOW UP VISIT NOTE  [name] Date of Birth:  [birth year] Medical Record Number:  [id number]   Referring MD:  Terie Purser, PA-C Reason for Referral:  ? seizure HPI: Initial Consult 01/08/2016:[name] is a 26 year Caucasian lady who is accompanied today by her mother. Patient had episode on 12/15/15 when the mother who sleeps in the same room as her workup the patient making making some noises and asleep. She was unable to wake up the patient and felt concerned and eventually called EMS. Patient eventually woke up and she was in the ambulance and reached reaching the hospital. She felt slightly confused and dizzy and lightheaded and felt tired and fatigued. She did not remember events for couple of hours. The patient since was seen in the emergency room and felt to be dehydrated and had possibly had a syncope. All lab work done in the year including chemistry panel and CBC were unremarkable. No brain imaging was done. Patient subsequently went the has had 2 other episodes since sleep: Mother has noticed that she's had involuntary extension of her right arm going up and the mother had to force her arm down. The patient did not have any jerky movements at that time. It took her 15 minutes to awaken the patient and that time. The patient has had no events during the daytime. She has a remote history of a single febrile seizure as a child  2 in [birth year] but none since then. She was not placed on any seizure medications. She denies any history of head injuries significant loss of consciousness stroke, TIA or migraine headaches. We'll have trouble sleeping and does not get restful sleep and feels quite tired during the day. She has no history of sleep apnea. She works as the Publishing rights manager. She does admit that she is under a lot of stress. She's not had a sleep study done. Update 04/12/16 : She  returns for follow-up after last visit 3 months ago. She states she's had no other major episode of confusion and not remembering events in sleep. She's had only one minor episode in which the mother felt that she was jerking in sleep but stopped when the bundle talked to her. She reports occasional episodes of losing her train of thought when speaking for just a few seconds and staring but she is fully aware of for Ringing. She does not lose consciousness. She does admit to systems stressors at home she has not been part spitting in any activities for stress laxation. The patient was seen by Dr. Merton Border for evaluation of polysomnogram but it was not approved by insurance company and she underwent home sleep study test which showed no evidence of sleep apnea. Patient underwent EEG study on 01/23/2016 which showed slight focal irritability in the right temporal lobes with rare isolated sharp waves. Home sleep study done on 02/18/16 showed no evidence of sleep apnea. MRI scan of the brain done on 02/18/16 was also normal. She denies any headaches or focal extremity weakness numbness or loss of consciousness. 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 Seizures (HCC)  seizures at age 26    Social History:  Social History   Social History  . Marital status: Single    Spouse name: N/A  . Number of children: 0  . Years of education: Assoc    Occupational History  . Not on file.   Social History Main Topics  . Smoking status: Never Smoker  . Smokeless tobacco: Never Used  . Alcohol use No  . Drug use: No  . Sexual activity: Not on file   Other Topics Concern  . Not on file   Social History Narrative   Drinks about 5 caffeine  drinks a day     Medications:   Current Outpatient Prescriptions on File Prior to Visit  Medication Sig Dispense Refill  . fluticasone (FLONASE) 50 MCG/ACT nasal spray Place 2 sprays into both nostrils daily. 9.9 g 2  . vitamin C (ASCORBIC ACID) 500 MG tablet Take 500 mg by mouth daily.     Current Facility-Administered Medications on File Prior to Visit  Medication Dose Route Frequency Provider Last Rate Last Dose  . gadopentetate dimeglumine (MAGNEVIST) injection 10 mL  10 mL Intravenous Once PRN Micki Riley, MD        Allergies:   Allergies  Allergen Reactions  . Prednisone Shortness Of Breath    rash    Physical Exam General: Frail cachectic-looking young Caucasian lady seated, in no evident distress Head: head normocephalic and atraumatic.   Neck: supple with no carotid or supraclavicular bruits Cardiovascular: regular rate and rhythm, no murmurs Musculoskeletal: no deformity Skin:  no rash/petichiae Vascular:  Normal pulses all extremities  Neurologic Exam Mental Status: Awake and fully alert. Oriented to place and time. Recent and remote memory intact. Attention span, concentration and fund of knowledge appropriate. Mood and affect appropriate.  Cranial Nerves: Fundoscopic exam not done. Pupils equal, briskly reactive to light. Extraocular movements full without nystagmus. Visual fields full to confrontation. Hearing intact. Facial sensation intact. Face, tongue, palate moves normally and symmetrically.  Motor: Normal bulk and tone. Normal strength in all tested extremity muscles. Sensory.: intact to touch , pinprick , position and vibratory sensation.  Coordination: Rapid alternating movements normal in all extremities. Finger-to-nose and heel-to-shin performed accurately bilaterally. Gait and Station: Arises from chair without difficulty. Stance is normal. Gait demonstrates normal stride length and balance . Able to heel, toe and tandem walk without difficulty.    Reflexes: 1+ and symmetric. Toes downgoing.       ASSESSMENT: 26 year Caucasian lady with episode of unresponsiveness followed by some memory loss in Dec 2017 and 2 other brief episodes of involuntary movements in sleep of unclear etiology. 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 I have personally reviewed the imaging studies and discuss EEG results with her and answered questions. I would like to repeat a sleep deprived EEG as a baseline EEG was slightly abnormal. I do not believe trial off seizure medication is indicated at the present time unless she has recurrent and worsening episodes. I also advised her to increase participation in activities for stress laxation like regular exercise, swimming, medication and yoga. She will return for follow-up in 3 months or call earlier if necessary.Greater than 50% time during this 25 minute  visit was spent on counseling and coordination of care about her sleep disturbance episodes and seizures Return  for follow-up in 3 months or call earlier if necessary Antony Contras, MD  [facility] [address] [place], [place] [address]  Phone [phone] Fax [phone] Note: This document was prepared with digital dictation and possible smart phrase technology. Any transcriptional errors that result from this process are unintentional.

## 2016-04-12 NOTE — Patient Instructions (Signed)
I had a long discussion with the patient regarding her involuntary leg movements in sleep as well as sequela of altered consciousness. I have personally reviewed the imaging studies and discuss EEG results with her and answered questions. I would like to repeat a sleep deprived EEG as a baseline EEG was slightly abnormal. I do not believe trial off seizure medication is indicated at the present time unless she has recurrent and worsening episodes. I also advised her to increase participation in activities for stress laxation like regular exercise, swimming, medication and yoga. She will return for follow-up in 3 months or call earlier if necessary

## 2016-04-30 ENCOUNTER — Other Ambulatory Visit: Payer: BLUE CROSS/BLUE SHIELD

## 2016-05-17 ENCOUNTER — Other Ambulatory Visit: Payer: BLUE CROSS/BLUE SHIELD

## 2016-05-24 ENCOUNTER — Telehealth: Payer: Self-pay | Admitting: Neurology

## 2016-05-24 NOTE — Telephone Encounter (Signed)
Left message for a return call

## 2016-05-24 NOTE — Telephone Encounter (Signed)
Pt would like a call back to discuss the EEG testing on tomorrow and what she should and should not do, please call

## 2016-05-24 NOTE — Telephone Encounter (Signed)
Spoke to patient - she is aware that a complete sleep deprived EEG requires no sleep for 24 hours and clean hair that is free from hair products.  She may take her prescribed medications and eat meals as normal.

## 2016-05-25 ENCOUNTER — Encounter: Payer: Self-pay | Admitting: Neurology

## 2016-05-25 ENCOUNTER — Other Ambulatory Visit (INDEPENDENT_AMBULATORY_CARE_PROVIDER_SITE_OTHER): Payer: BLUE CROSS/BLUE SHIELD

## 2016-05-25 DIAGNOSIS — G40909 Epilepsy, unspecified, not intractable, without status epilepticus: Secondary | ICD-10-CM | POA: Diagnosis not present

## 2016-06-08 ENCOUNTER — Other Ambulatory Visit: Payer: Self-pay | Admitting: Neurology

## 2016-06-08 DIAGNOSIS — R404 Transient alteration of awareness: Secondary | ICD-10-CM

## 2016-06-16 ENCOUNTER — Telehealth: Payer: Self-pay | Admitting: *Deleted

## 2016-06-16 NOTE — Telephone Encounter (Signed)
-----   Message from Micki RileyPramod S Sethi, MD sent at 06/12/2016 12:18 PM EDT ----- Sarah RoachKindly call the patient and inform her that sleep deprived EEG study was normal

## 2016-06-16 NOTE — Telephone Encounter (Signed)
Called and LVM for pt about normal sleep deprived EEG per Dr Pearlean BrownieSethi note. Gave [facility] phone number if she has further questions or concerns.

## 2017-07-06 ENCOUNTER — Ambulatory Visit: Payer: BLUE CROSS/BLUE SHIELD | Admitting: Neurology

## 2019-01-10 ENCOUNTER — Ambulatory Visit: Payer: BLUE CROSS/BLUE SHIELD | Attending: Internal Medicine

## 2019-01-10 DIAGNOSIS — Z20822 Contact with and (suspected) exposure to covid-19: Secondary | ICD-10-CM | POA: Insufficient documentation

## 2019-01-12 LAB — NOVEL CORONAVIRUS, NAA: SARS-CoV-2, NAA: NOT DETECTED

## 2019-02-12 ENCOUNTER — Ambulatory Visit: Payer: BLUE CROSS/BLUE SHIELD | Attending: Internal Medicine

## 2019-02-12 DIAGNOSIS — Z20822 Contact with and (suspected) exposure to covid-19: Secondary | ICD-10-CM

## 2019-02-13 LAB — NOVEL CORONAVIRUS, NAA: SARS-CoV-2, NAA: NOT DETECTED

## 2019-09-25 ENCOUNTER — Other Ambulatory Visit: Payer: BLUE CROSS/BLUE SHIELD

## 2019-09-25 DIAGNOSIS — Z20822 Contact with and (suspected) exposure to covid-19: Secondary | ICD-10-CM

## 2019-09-27 ENCOUNTER — Ambulatory Visit: Payer: Self-pay

## 2019-09-27 LAB — NOVEL CORONAVIRUS, NAA: SARS-CoV-2, NAA: DETECTED — AB

## 2019-09-27 LAB — SARS-COV-2, NAA 2 DAY TAT

## 2019-09-27 NOTE — Telephone Encounter (Signed)
Pt. Called to report chest heaviness, and feeling like her heart is fluttering at times.  Reported an earlier pulse of 94.  Had onset of nasal congestion and cough on Tues.  Tested positive for COVID. Reported she had a phone visit today, with Dr. Renette Butters, from her Primary Care office.  Stated she was prescribed a Z-Pak, and advised to take Mucinex DM.  Reported the medication is to be delivered from pharmacy, but has not rec'd yet.  Stated she has a dry cough.  Feels like her chest is congested.  Denied shortness of breath. Stated she has intermittent chills, but denied actual fever, when she checks her temperature.  Reported she is "a little stronger today."  Reported pulse of 62 and oxygen saturation of 98 %, at present time.  Advised to drink water, juices, and broth to maintain hydration.  Encouraged to use a humidifier at the bedside to thin secretions.  Advised to monitor for increasing weakness, increasing chest heaviness, and shortness of breath, and to go to ER if symptoms progress.  Advised to start on the antibiotic and Mucinex DM as soon as possible.   Pt. reported that the antibiotic and Mucinex have been delivered, during our phone call. Care advice given per protocol.  Pt. Verb. Understanding.     Reason for Disposition  Chest pain or pressure    C/o chest heaviness in the sternum and intermittently heart feels like it is fluttering  Answer Assessment - Initial Assessment Questions 1. COVID-19 DIAGNOSIS: "Who made your Coronavirus (COVID-19) diagnosis?" "Was it confirmed by a positive lab test?" If not diagnosed by a HCP, ask "Are there lots of cases (community spread) where you live?" (See public health department website, if unsure)     Positive for COVID (tested on 9/21) 2. COVID-19 EXPOSURE: "Was there any known exposure to COVID before the symptoms began?" CDC Definition of close contact: within 6 feet (2 meters) for a total of 15 minutes or more over a 24-hour period.     N/a  3.  ONSET: "When did the COVID-19 symptoms start?"      09/25/19 4. WORST SYMPTOM: "What is your worst symptom?" (e.g., cough, fever, shortness of breath, muscle aches)     Dry cough, heart feels like it is fluttering, Pulse is 94,  5. COUGH: "Do you have a cough?" If Yes, ask: "How bad is the cough?"       Dry cough 6. FEVER: "Do you have a fever?" If Yes, ask: "What is your temperature, how was it measured, and when did it start?"     Denied fever, but c/o chills 7. RESPIRATORY STATUS: "Describe your breathing?" (e.g., shortness of breath, wheezing, unable to speak)     Denied shortness of breath; denied wheezes 8. BETTER-SAME-WORSE: "Are you getting better, staying the same or getting worse compared to yesterday?"  If getting worse, ask, "In what way?"     Feeling a little stronger today 9. HIGH RISK DISEASE: "Do you have any chronic medical problems?" (e.g., asthma, heart or lung disease, weak immune system, obesity, etc.)     Denied asthma, denied chronic medical problems 10. PREGNANCY: "Is there any chance you are pregnant?" "When was your last menstrual period?"       LMP- early in Sept. ; denied pregnancy  11. OTHER SYMPTOMS: "Do you have any other symptoms?"  (e.g., chills, fatigue, headache, loss of smell or taste, muscle pain, sore throat; new loss of smell or taste especially support the diagnosis  of COVID-19)       Pulse 62, oxygen saturation is 98 %  Protocols used: CORONAVIRUS (COVID-19) DIAGNOSED OR SUSPECTED-A-AH

## 2022-07-06 ENCOUNTER — Encounter (HOSPITAL_COMMUNITY): Payer: Self-pay

## 2022-07-06 ENCOUNTER — Ambulatory Visit (HOSPITAL_COMMUNITY)
Admission: EM | Admit: 2022-07-06 | Discharge: 2022-07-06 | Disposition: A | Discharge status: Home / Self Care | Payer: No Typology Code available for payment source | Attending: Internal Medicine | Admitting: Internal Medicine

## 2022-07-06 DIAGNOSIS — N3000 Acute cystitis without hematuria: Secondary | ICD-10-CM | POA: Diagnosis present

## 2022-07-06 LAB — POCT URINALYSIS DIP (MANUAL ENTRY)
Bilirubin, UA: NEGATIVE
Glucose, UA: NEGATIVE mg/dL
Ketones, POC UA: NEGATIVE mg/dL
Leukocytes, UA: NEGATIVE
Nitrite, UA: NEGATIVE
Protein Ur, POC: 100 mg/dL — AB
Spec Grav, UA: 1.025 (ref 1.010–1.025)
Urobilinogen, UA: 0.2 E.U./dL
pH, UA: 5 (ref 5.0–8.0)

## 2022-07-06 LAB — POCT URINE PREGNANCY: Preg Test, Ur: NEGATIVE

## 2022-07-06 MED ORDER — PHENAZOPYRIDINE HCL 95 MG PO TABS: 95.0000 mg | ORAL_TABLET | Freq: Three times a day (TID) | ORAL | 0 refills | Status: AC | PRN

## 2022-07-06 MED ORDER — NITROFURANTOIN MONOHYD MACRO 100 MG PO CAPS: 100.0000 mg | ORAL_CAPSULE | Freq: Two times a day (BID) | ORAL | 0 refills | Status: AC

## 2022-07-06 NOTE — Discharge Instructions (Addendum)
Your urine showed blood today in clinic, combined with your symptoms I am treating you for urinary tract infection.  Please take antibiotics as prescribed and until finished, you can take them with food to prevent gastrointestinal upset.  He can use the Pyridium up to 3 times daily as needed for pain and discomfort with urination.  This will change the color of your urine.  Ensure you are drinking at least 64 ounces of water daily to flush your kidneys and abstain from juice or sodas.  We are sending your urine off for culture and we will contact you if we need to modify your antibiotic treatment.  Will contact you if anything results is abnormal with your vaginal swab such as yeast or bacterial vaginosis.  Please return to clinic for any new or concerning symptoms.

## 2022-07-06 NOTE — ED Triage Notes (Signed)
Pt c/o vaginal irritation, burning on urination, and frequency intermittent for a week. States has blood when wiping after urination since 2 days.

## 2022-07-06 NOTE — ED Provider Notes (Signed)
[facility]    CSN: [id number] Arrival date & time: 07/06/22  1033      History   Chief Complaint Chief Complaint  Patient presents with   Urinary Tract Infection    HPI Sarah Valencia is a 32 y.o. female.   Patient presents to clinic for complaints of vaginal irritation, dysuria and blood with wiping.  Vaginal irritation started last week, she was unsure if this was due to a sensitivity to soaps or feminine wash that she used.  She noticed over the past few days she has had blood with wiping.  She did wear a pad in case her menses was coming, did not have any vaginal bleeding.  Did have some itching last week.  All concerns for sexually transmitted infections, she is not sexually active.  Her menses should be starting soon.  She denies any nausea, vomiting, flank pain or fevers.  Has had some suprapubic pain.     The history is provided by the patient and medical records.  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      Home Medications    Prior to Admission medications   Medication Sig Start Date End Date Taking? Authorizing Provider  nitrofurantoin, macrocrystal-monohydrate, (MACROBID) 100 MG capsule Take 1 capsule (100 mg total) by mouth 2 (two) times daily. 07/06/22  Yes Rinaldo Ratel, [place] N, FNP  phenazopyridine (PYRIDIUM) 95 MG tablet Take 1 tablet (95 mg total) by mouth 3 (three) times daily as needed for pain. 07/06/22  Yes Rinaldo Ratel, [place] N, FNP  fluticasone [facility]) 50 MCG/ACT nasal spray Place  2 sprays into both nostrils daily. 04/02/15   Tharon Aquas, PA  Omega-3 Fatty Acids (FISH OIL) 1200 MG CAPS Take by mouth.    [provider]  vitamin C (ASCORBIC ACID) 500 MG tablet Take 500 mg by mouth daily.    [provider]    Family History Family History  Problem Relation Age of Onset   Hyperlipidemia Mother    Lung cancer Father     Social History Social History   Tobacco Use   Smoking status: Never   Smokeless tobacco: Never  Substance Use Topics   Alcohol use: No   Drug use: No     Allergies   Prednisone   Review of Systems Review of Systems  Constitutional:  Negative for fever.  Gastrointestinal:  Negative for nausea and vomiting.  Genitourinary:  Positive for dysuria. Negative for flank pain, menstrual problem, vaginal bleeding, vaginal discharge and vaginal pain.     Physical Exam Triage Vital Signs ED Triage Vitals  Enc Vitals Group     BP 07/06/22 1045 126/86     Pulse Rate 07/06/22 1045 76     Resp 07/06/22 1045 18     Temp 07/06/22 1045 98.4 F (36.9 C)     Temp Source 07/06/22 1045 Oral     SpO2 07/06/22 1045 96 %  Weight --      Height --      Head Circumference --      Peak Flow --      Pain Score 07/06/22 1046 0     Pain Loc --      Pain Edu? --      Excl. in GC? --    No data found.  Updated Vital Signs BP 126/86 (BP Location: Right Arm)   Pulse 76   Temp 98.4 F (36.9 C) (Oral)   Resp 18   LMP 06/13/2022   SpO2 96%   Visual Acuity Right Eye Distance:   Left Eye Distance:   Bilateral Distance:    Right Eye Near:   Left Eye Near:    Bilateral Near:     Physical Exam Vitals and nursing note reviewed.  Constitutional:      Appearance: Normal appearance.  HENT:     Head: Normocephalic and atraumatic.     Right Ear: External ear normal.     Left Ear: External ear normal.     Nose: Nose normal.     Mouth/Throat:     Mouth: Mucous membranes are moist.  Eyes:     General: No scleral  icterus. Cardiovascular:     Rate and Rhythm: Normal rate.  Pulmonary:     Effort: Pulmonary effort is normal. No respiratory distress.  Abdominal:     Tenderness: There is no right CVA tenderness or left CVA tenderness.  Skin:    General: Skin is warm and dry.  Neurological:     General: No focal deficit present.     Mental Status: She is alert and oriented to person, place, and time.  Psychiatric:        Mood and Affect: Mood normal.        Behavior: Behavior is cooperative.      UC Treatments / Results  Labs (all labs ordered are listed, but only abnormal results are displayed) Labs Reviewed  POCT URINALYSIS DIP (MANUAL ENTRY) - Abnormal; Notable for the following components:      Result Value   Blood, UA large (*)    Protein Ur, POC =100 (*)    All other components within normal limits  URINE CULTURE  POCT URINE PREGNANCY  CERVICOVAGINAL ANCILLARY ONLY    EKG   Radiology No results found.  Procedures Procedures (including critical care time)  Medications Ordered in UC Medications - No data to display  Initial Impression / Assessment and Plan / UC Course  I have reviewed the triage vital signs and the nursing notes.  Pertinent labs & imaging results that were available during my care of the patient were reviewed by me and considered in my medical decision making (see chart for details).  Vitals and triage reviewed, patient is hemodynamically stable.  Reports dysuria and blood with wiping since last week with some vaginal irritation.  Urinalysis shows large red blood cells and protein.  Negative for nitrates or leukocytes.  Due to symptoms, will cover with Macrobid and send urine for culture.  Low concern for pyelonephritis, negative for CVA tenderness, nausea, vomiting or flank pain.  Cytology swab obtained to rule out BV versus yeast as well.  Staff to contact if anything results is abnormal or treatment needs to be modified.  Plan of care, follow-up care and  return precautions given, no questions at this time.     Final Clinical Impressions(s) / UC Diagnoses   Final diagnoses:  Acute cystitis without  hematuria     Discharge Instructions      Your urine showed blood today in clinic, combined with your symptoms I am treating you for urinary tract infection.  Please take antibiotics as prescribed and until finished, you can take them with food to prevent gastrointestinal upset.  He can use the Pyridium up to 3 times daily as needed for pain and discomfort with urination.  This will change the color of your urine.  Ensure you are drinking at least 64 ounces of water daily to flush your kidneys and abstain from juice or sodas.  We are sending your urine off for culture and we will contact you if we need to modify your antibiotic treatment.  Will contact you if anything results is abnormal with your vaginal swab such as yeast or bacterial vaginosis.  Please return to clinic for any new or concerning symptoms.      ED Prescriptions     Medication Sig Dispense Auth. Provider   nitrofurantoin, macrocrystal-monohydrate, (MACROBID) 100 MG capsule Take 1 capsule (100 mg total) by mouth 2 (two) times daily. 10 capsule Sulayman Manning, [place] N, FNP   phenazopyridine (PYRIDIUM) 95 MG tablet Take 1 tablet (95 mg total) by mouth 3 (three) times daily as needed for pain. 10 tablet Malvika Tung, [place] N, FNP      PDMP not reviewed this encounter.   Emonni Depasquale, [place] N, [place] 07/06/22 1134

## 2022-07-07 LAB — URINE CULTURE

## 2022-07-07 LAB — CERVICOVAGINAL ANCILLARY ONLY
Bacterial Vaginitis (gardnerella): POSITIVE — AB
Candida Glabrata: NEGATIVE
Candida Vaginitis: POSITIVE — AB
Chlamydia: NEGATIVE
Comment: NEGATIVE
Comment: NEGATIVE
Comment: NEGATIVE
Comment: NEGATIVE
Comment: NEGATIVE
Comment: NORMAL
Neisseria Gonorrhea: NEGATIVE
Trichomonas: NEGATIVE

## 2022-07-08 ENCOUNTER — Telehealth (HOSPITAL_COMMUNITY): Payer: Self-pay | Admitting: Emergency Medicine

## 2022-07-08 MED ORDER — FLUCONAZOLE 150 MG PO TABS: 150.0000 mg | ORAL_TABLET | Freq: Once | ORAL | 0 refills | Status: AC

## 2022-07-08 MED ORDER — METRONIDAZOLE 500 MG PO TABS: 500.0000 mg | ORAL_TABLET | Freq: Two times a day (BID) | ORAL | 0 refills | Status: AC

## 2024-03-09 ENCOUNTER — Ambulatory Visit: Admitting: Physician Assistant
